# Patient Record
Sex: Female | Born: 1990 | Race: Black or African American | Hispanic: No | Marital: Single | Smoking: Never smoker
Health system: Southern US, Community
[De-identification: ages and names within clinical notes are randomized; demographics above are authoritative.]

---

## 2014-07-29 ENCOUNTER — Encounter (HOSPITAL_COMMUNITY): Payer: Self-pay | Admitting: Emergency Medicine

## 2014-07-29 ENCOUNTER — Emergency Department (HOSPITAL_COMMUNITY)
Admission: EM | Admit: 2014-07-29 | Discharge: 2014-07-30 | Disposition: A | Discharge status: Home / Self Care | Payer: Self-pay | Attending: Emergency Medicine | Admitting: Emergency Medicine

## 2014-07-29 DIAGNOSIS — N390 Urinary tract infection, site not specified: Secondary | ICD-10-CM | POA: Insufficient documentation

## 2014-07-29 DIAGNOSIS — R102 Pelvic and perineal pain: Secondary | ICD-10-CM

## 2014-07-29 DIAGNOSIS — D259 Leiomyoma of uterus, unspecified: Secondary | ICD-10-CM | POA: Insufficient documentation

## 2014-07-29 DIAGNOSIS — Z3202 Encounter for pregnancy test, result negative: Secondary | ICD-10-CM | POA: Insufficient documentation

## 2014-07-29 DIAGNOSIS — R112 Nausea with vomiting, unspecified: Secondary | ICD-10-CM | POA: Insufficient documentation

## 2014-07-29 DIAGNOSIS — N8320 Unspecified ovarian cysts: Secondary | ICD-10-CM | POA: Insufficient documentation

## 2014-07-29 DIAGNOSIS — N83202 Unspecified ovarian cyst, left side: Secondary | ICD-10-CM

## 2014-07-29 DIAGNOSIS — R509 Fever, unspecified: Secondary | ICD-10-CM | POA: Insufficient documentation

## 2014-07-29 LAB — URINALYSIS, ROUTINE W REFLEX MICROSCOPIC
BILIRUBIN URINE: NEGATIVE
Glucose, UA: NEGATIVE mg/dL
Ketones, ur: 15 mg/dL — AB
NITRITE: NEGATIVE
PROTEIN: 100 mg/dL — AB
SPECIFIC GRAVITY, URINE: 1.023 (ref 1.005–1.030)
UROBILINOGEN UA: 1 mg/dL (ref 0.0–1.0)
pH: 8 (ref 5.0–8.0)

## 2014-07-29 LAB — CBC WITH DIFFERENTIAL/PLATELET
BASOS ABS: 0.1 10*3/uL (ref 0.0–0.1)
BASOS PCT: 1 % (ref 0–1)
EOS ABS: 0.1 10*3/uL (ref 0.0–0.7)
Eosinophils Relative: 2 % (ref 0–5)
HCT: 36.9 % (ref 36.0–46.0)
HEMOGLOBIN: 12.7 g/dL (ref 12.0–15.0)
Lymphocytes Relative: 38 % (ref 12–46)
Lymphs Abs: 2.7 10*3/uL (ref 0.7–4.0)
MCH: 29.7 pg (ref 26.0–34.0)
MCHC: 34.4 g/dL (ref 30.0–36.0)
MCV: 86.2 fL (ref 78.0–100.0)
MONO ABS: 0.3 10*3/uL (ref 0.1–1.0)
MONOS PCT: 5 % (ref 3–12)
Neutro Abs: 3.7 10*3/uL (ref 1.7–7.7)
Neutrophils Relative %: 54 % (ref 43–77)
Platelets: 322 10*3/uL (ref 150–400)
RBC: 4.28 MIL/uL (ref 3.87–5.11)
RDW: 13.3 % (ref 11.5–15.5)
WBC: 6.9 10*3/uL (ref 4.0–10.5)

## 2014-07-29 LAB — WET PREP, GENITAL
Trich, Wet Prep: NONE SEEN
WBC, Wet Prep HPF POC: NONE SEEN
Yeast Wet Prep HPF POC: NONE SEEN

## 2014-07-29 LAB — COMPREHENSIVE METABOLIC PANEL
ALT: 8 U/L (ref 0–35)
AST: 19 U/L (ref 0–37)
Albumin: 4 g/dL (ref 3.5–5.2)
Alkaline Phosphatase: 61 U/L (ref 39–117)
Anion gap: 10 (ref 5–15)
BUN: 8 mg/dL (ref 6–23)
CALCIUM: 9.2 mg/dL (ref 8.4–10.5)
CO2: 19 mmol/L (ref 19–32)
Chloride: 108 mEq/L (ref 96–112)
Creatinine, Ser: 0.74 mg/dL (ref 0.50–1.10)
GFR calc Af Amer: >90 mL/min (ref >90)
Glucose, Bld: 112 mg/dL — ABNORMAL HIGH (ref 70–99)
POTASSIUM: 3.1 mmol/L — AB (ref 3.5–5.1)
Sodium: 137 mmol/L (ref 135–145)
Total Bilirubin: 0.4 mg/dL (ref 0.3–1.2)
Total Protein: 7.4 g/dL (ref 6.0–8.3)

## 2014-07-29 LAB — URINE MICROSCOPIC-ADD ON

## 2014-07-29 LAB — PREGNANCY, URINE: Preg Test, Ur: NEGATIVE

## 2014-07-29 NOTE — ED Notes (Signed)
Dr Yelverton at bedside.  

## 2014-07-29 NOTE — ED Notes (Signed)
Pt. reports low abdominal /menstrual cramping radiating to lower back onset today with emesis ( x1) , denies diarrhea , no fever or chills.

## 2014-07-29 NOTE — ED Provider Notes (Signed)
CSN: [id number]     Arrival date & time 07/29/14  1916 History  This chart was scribed for Julianne Rice, MD by Rayfield Citizen, ED Scribe. This patient was seen in room B14C/B14C and the patient's care was started at 11:09 PM.    Chief Complaint  Patient presents with  . Abdominal Pain   Patient is a 24 y.o. female presenting with abdominal pain. The history is provided by the patient. A language interpreter was used.  Abdominal Pain Associated symptoms: dysuria, fever, nausea, vaginal bleeding and vomiting   Associated symptoms: no chills, no constipation, no diarrhea and no vaginal discharge      HPI Comments: Shelia French is a 24 y.o. female who presents to the Emergency Department complaining of lower abdominal pain described as cramping (L>R) with associated subjective fever, nausea, vomiting (1x).  She reports that this pain began 4 hours ago; her pain has improved slightly since arriving in the ED tonight. Patient explains that her pain radiates around both sides of her back and down both legs as well. Mother inserts that patient has also experienced urinary symptoms. She attempted to treat her pain with ibuprofen. She denies nausea at present, diarrhea, or chills. She denies prior experience with this severity of symptoms; her pain is "a little different" than prior menstrual cramps.  She is on her period at this time (3rd day of her cycle). She denies any other abnormal discharge.    History reviewed. No pertinent past medical history. History reviewed. No pertinent past surgical history. No family history on file. History  Substance Use Topics  . Smoking status: Never Smoker   . Smokeless tobacco: Not on file  . Alcohol Use: No   OB History    No data available     Review of Systems  Constitutional: Positive for fever. Negative for chills.  Gastrointestinal: Positive for nausea, vomiting and abdominal pain. Negative for diarrhea and constipation.  Genitourinary: Positive for  dysuria, frequency, vaginal bleeding and pelvic pain. Negative for vaginal discharge.  Skin: Negative for rash and wound.  Neurological: Negative for weakness, numbness and headaches.  All other systems reviewed and are negative.     Allergies  Review of patient's allergies indicates no known allergies.  Home Medications   Prior to Admission medications   Not on File   BP 97/67 mmHg  Pulse 59  Temp(Src) 99.1 F (37.3 C) (Oral)  Resp 15  SpO2 100%  LMP 07/26/2014 Physical Exam  Constitutional: She is oriented to person, place, and time. She appears well-developed and well-nourished. No distress.  HENT:  Head: Normocephalic and atraumatic.  Mouth/Throat: Oropharynx is clear and moist.  Eyes: EOM are normal. Pupils are equal, round, and reactive to light.  Neck: Normal range of motion. Neck supple.  Cardiovascular: Normal rate and regular rhythm.   Pulmonary/Chest: Effort normal and breath sounds normal. No respiratory distress. She has no wheezes. She has no rales.  Abdominal: Soft. Bowel sounds are normal. She exhibits no distension and no mass. There is tenderness (Pt has bl lower abd TTP worse on left). There is no rebound and no guarding.  Genitourinary:  Dark blood in vaginal vault. No active bleeding. Yellow cervical discharge. No definite cervical motion tenderness. Bilateral adnexal tenderness appears to be worse on the right than on the left. No masses appreciated  Musculoskeletal: Normal range of motion. She exhibits no edema or tenderness.  No CVA tenderness bilaterally.  Neurological: She is alert and oriented to person, place, and  time.  Skin: Skin is warm and dry. No rash noted. No erythema.  Psychiatric: She has a normal mood and affect. Her behavior is normal.  Nursing note and vitals reviewed.   ED Course  Procedures   DIAGNOSTIC STUDIES: Oxygen Saturation is 100% on RA, normal by my interpretation.    COORDINATION OF CARE: 11:23 PM Discussed treatment  plan with pt at bedside and pt agreed to plan.   Labs Review Labs Reviewed  WET PREP, GENITAL - Abnormal; Notable for the following:    Clue Cells Wet Prep HPF POC FEW (*)    All other components within normal limits  COMPREHENSIVE METABOLIC PANEL - Abnormal; Notable for the following:    Potassium 3.1 (*)    Glucose, Bld 112 (*)    All other components within normal limits  URINALYSIS, ROUTINE W REFLEX MICROSCOPIC - Abnormal; Notable for the following:    Color, Urine RED (*)    APPearance TURBID (*)    Hgb urine dipstick LARGE (*)    Ketones, ur 15 (*)    Protein, ur 100 (*)    Leukocytes, UA MODERATE (*)    All other components within normal limits  URINE MICROSCOPIC-ADD ON - Abnormal; Notable for the following:    Squamous Epithelial / LPF FEW (*)    Bacteria, UA MANY (*)    All other components within normal limits  GC/CHLAMYDIA PROBE AMP  CBC WITH DIFFERENTIAL  PREGNANCY, URINE  HIV ANTIBODY (ROUTINE TESTING)    Imaging Review US Transvaginal Non-ob  07/30/2014   CLINICAL DATA:  Pelvic pain.  EXAM: TRANSABDOMINAL AND TRANSVAGINAL ULTRASOUND OF PELVIS  DOPPLER ULTRASOUND OF OVARIES  TECHNIQUE: Both transabdominal and transvaginal ultrasound examinations of the pelvis were performed. Transabdominal technique was performed for global imaging of the pelvis including uterus, ovaries, adnexal regions, and pelvic cul-de-sac.  It was necessary to proceed with endovaginal exam following the transabdominal exam to visualize the ovaries and endometrium. Color and duplex Doppler ultrasound was utilized to evaluate blood flow to the ovaries.  COMPARISON:  None.  FINDINGS: Uterus  Measurements: 8.1 x 4.5 x 9.9 cm. Hypoechoic 2.1 x 1.7 x 1.9 cm hypoechoic mass within the uterine fundus intramural leiomyoma.  Endometrium  Thickness: 1.1 cm.  No focal abnormality visualized.  Right ovary irregular in shape.  Measurements: 6.1 x 5.2 x 5.4 cm. 5.4 x 4.2 x 4.5 cm anechoic cyst with a small amount of  layering debris, this cyst is somewhat.  Left ovary  Measurements: 3.9 x 2.3 x 2.1 cm. Normal appearance/no adnexal mass.  Pulsed Doppler evaluation of both ovaries demonstrates normal low-resistance arterial and venous waveforms.  Other findings  No free fluid.  IMPRESSION: 4.5 cm indeterminate but likely benign LEFT adnexal cyst for which follow-up pelvic ultrasound in 6-12 weeks is recommended.  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   Electronically Signed   By: Elon Alas   On: 07/30/2014 03:29   US Pelvis Complete  07/30/2014   CLINICAL DATA:  Pelvic pain.  EXAM: TRANSABDOMINAL AND TRANSVAGINAL ULTRASOUND OF PELVIS  DOPPLER ULTRASOUND OF OVARIES  TECHNIQUE: Both transabdominal and transvaginal ultrasound examinations of the pelvis were performed. Transabdominal technique was performed for global imaging of the pelvis including uterus, ovaries, adnexal regions, and pelvic cul-de-sac.  It was necessary to proceed with endovaginal exam following the transabdominal exam to visualize the ovaries and endometrium. Color and duplex Doppler ultrasound was utilized to evaluate blood flow to the ovaries.  COMPARISON:  None.  FINDINGS:  Uterus  Measurements: 8.1 x 4.5 x 9.9 cm. Hypoechoic 2.1 x 1.7 x 1.9 cm hypoechoic mass within the uterine fundus intramural leiomyoma.  Endometrium  Thickness: 1.1 cm.  No focal abnormality visualized.  Right ovary irregular in shape.  Measurements: 6.1 x 5.2 x 5.4 cm. 5.4 x 4.2 x 4.5 cm anechoic cyst with a small amount of layering debris, this cyst is somewhat.  Left ovary  Measurements: 3.9 x 2.3 x 2.1 cm. Normal appearance/no adnexal mass.  Pulsed Doppler evaluation of both ovaries demonstrates normal low-resistance arterial and venous waveforms.  Other findings  No free fluid.  IMPRESSION: 4.5 cm indeterminate but likely benign LEFT adnexal cyst for which follow-up pelvic ultrasound in 6-12 weeks is recommended.  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   Electronically  Signed   By: Elon Alas   On: 07/30/2014 03:29   [place] Art/ven Flow Abd Pelv Doppler  07/30/2014   CLINICAL DATA:  Pelvic pain.  EXAM: TRANSABDOMINAL AND TRANSVAGINAL ULTRASOUND OF PELVIS  DOPPLER ULTRASOUND OF OVARIES  TECHNIQUE: Both transabdominal and transvaginal ultrasound examinations of the pelvis were performed. Transabdominal technique was performed for global imaging of the pelvis including uterus, ovaries, adnexal regions, and pelvic cul-de-sac.  It was necessary to proceed with endovaginal exam following the transabdominal exam to visualize the ovaries and endometrium. Color and duplex Doppler ultrasound was utilized to evaluate blood flow to the ovaries.  COMPARISON:  None.  FINDINGS: Uterus  Measurements: 8.1 x 4.5 x 9.9 cm. Hypoechoic 2.1 x 1.7 x 1.9 cm hypoechoic mass within the uterine fundus intramural leiomyoma.  Endometrium  Thickness: 1.1 cm.  No focal abnormality visualized.  Right ovary irregular in shape.  Measurements: 6.1 x 5.2 x 5.4 cm. 5.4 x 4.2 x 4.5 cm anechoic cyst with a small amount of layering debris, this cyst is somewhat.  Left ovary  Measurements: 3.9 x 2.3 x 2.1 cm. Normal appearance/no adnexal mass.  Pulsed Doppler evaluation of both ovaries demonstrates normal low-resistance arterial and venous waveforms.  Other findings  No free fluid.  IMPRESSION: 4.5 cm indeterminate but likely benign LEFT adnexal cyst for which follow-up pelvic ultrasound in 6-12 weeks is recommended.  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   Electronically Signed   By: Elon Alas   On: 07/30/2014 03:29     EKG Interpretation None      MDM   Final diagnoses:  Pelvic pain in female    I personally performed the services described in this documentation, which was scribed in my presence. The recorded information has been reviewed and is accurate.    Discuss results of workup at length with the patient through interpreter. Advised follow-up with OB/GYN clinic. She's been given  return precautions and has voiced understanding.  Julianne Rice, MD 07/30/14 [phone]

## 2014-07-30 ENCOUNTER — Emergency Department (HOSPITAL_COMMUNITY): Payer: Self-pay

## 2014-07-30 MED ORDER — NITROFURANTOIN MONOHYD MACRO 100 MG PO CAPS: 100.0000 mg | ORAL_CAPSULE | Freq: Two times a day (BID) | ORAL | Status: AC

## 2014-07-30 MED ORDER — IBUPROFEN 600 MG PO TABS: 600.0000 mg | ORAL_TABLET | Freq: Four times a day (QID) | ORAL | Status: AC | PRN

## 2014-07-30 MED ORDER — HYDROCODONE-ACETAMINOPHEN 5-325 MG PO TABS: 1.0000 | ORAL_TABLET | ORAL | Status: AC | PRN

## 2014-07-30 MED ORDER — HYDROCODONE-ACETAMINOPHEN 5-325 MG PO TABS
1.0000 | ORAL_TABLET | Freq: Once | ORAL | Status: AC
  Administered 2014-07-30: 1 via ORAL
  Filled 2014-07-30: qty 1

## 2014-07-30 NOTE — ED Notes (Signed)
Pt A&OX4, ambulatory at d/c with steady gait, NAD 

## 2014-07-30 NOTE — Discharge Instructions (Signed)
Make an appointment to follow-up with an OB/GYN. Return immediately for worsening pain, fever, increased vaginal bleeding or for any concerns.       Ovarian Cyst An ovarian cyst is a fluid-filled sac that forms on an ovary. The ovaries are small organs that produce eggs in women. Various types of cysts can form on the ovaries. Most are not cancerous. Many do not cause problems, and they often go away on their own. Some may cause symptoms and require treatment. Common types of ovarian cysts include: Functional cysts--These cysts may occur every month during the menstrual cycle. This is normal. The cysts usually go away with the next menstrual cycle if the woman does not get pregnant. Usually, there are no symptoms with a functional cyst. Endometrioma cysts--These cysts form from the tissue that lines the uterus. They are also called "chocolate cysts" because they become filled with blood that turns brown. This type of cyst can cause pain in the lower abdomen during intercourse and with your menstrual period. Cystadenoma cysts--This type develops from the cells on the outside of the ovary. These cysts can get very big and cause lower abdomen pain and pain with intercourse. This type of cyst can twist on itself, cut off its blood supply, and cause severe pain. It can also easily rupture and cause a lot of pain. Dermoid cysts--This type of cyst is sometimes found in both ovaries. These cysts may contain different kinds of body tissue, such as skin, teeth, hair, or cartilage. They usually do not cause symptoms unless they get very big. Theca lutein cysts--These cysts occur when too much of a certain hormone (human chorionic gonadotropin) is produced and overstimulates the ovaries to produce an egg. This is most common after procedures used to assist with the conception of a baby (in vitro fertilization). CAUSES  Fertility drugs can cause a condition in which multiple large cysts are formed on the ovaries.  This is called ovarian hyperstimulation syndrome. A condition called polycystic ovary syndrome can cause hormonal imbalances that can lead to nonfunctional ovarian cysts. SIGNS AND SYMPTOMS  Many ovarian cysts do not cause symptoms. If symptoms are present, they may include: Pelvic pain or pressure. Pain in the lower abdomen. Pain during sexual intercourse. Increasing girth (swelling) of the abdomen. Abnormal menstrual periods. Increasing pain with menstrual periods. Stopping having menstrual periods without being pregnant. DIAGNOSIS  These cysts are commonly found during a routine or annual pelvic exam. Tests may be ordered to find out more about the cyst. These tests may include: Ultrasound. X-ray of the pelvis. CT scan. MRI. Blood tests. TREATMENT  Many ovarian cysts go away on their own without treatment. Your health care provider may want to check your cyst regularly for 2-3 months to see if it changes. For women in menopause, it is particularly important to monitor a cyst closely because of the higher rate of ovarian cancer in menopausal women. When treatment is needed, it may include any of the following: A procedure to drain the cyst (aspiration). This may be done using a long needle and ultrasound. It can also be done through a laparoscopic procedure. This involves using a thin, lighted tube with a tiny camera on the end (laparoscope) inserted through a small incision. Surgery to remove the whole cyst. This may be done using laparoscopic surgery or an open surgery involving a larger incision in the lower abdomen. Hormone treatment or birth control pills. These methods are sometimes used to help dissolve a cyst. HOME CARE INSTRUCTIONS  Only take over-the-counter or prescription medicines as directed by your health care provider. Follow up with your health care provider as directed. Get regular pelvic exams and Pap tests. SEEK MEDICAL CARE IF:  Your periods are late, irregular, or  painful, or they stop. Your pelvic pain or abdominal pain does not go away. Your abdomen becomes larger or swollen. You have pressure on your bladder or trouble emptying your bladder completely. You have pain during sexual intercourse. You have feelings of fullness, pressure, or discomfort in your stomach. You lose weight for no apparent reason. You feel generally ill. You become constipated. You lose your appetite. You develop acne. You have an increase in body and facial hair. You are gaining weight, without changing your exercise and eating habits. You think you are pregnant. SEEK IMMEDIATE MEDICAL CARE IF:  You have increasing abdominal pain. You feel sick to your stomach (nauseous), and you throw up (vomit). You develop a fever that comes on suddenly. You have abdominal pain during a bowel movement. Your menstrual periods become heavier than usual. MAKE SURE YOU: Understand these instructions. Will watch your condition. Will get help right away if you are not doing well or get worse. Document Released: 07/09/2005 Document Revised: 07/14/2013 Document Reviewed: 03/16/2013 [facility] Patient Information 2015 [place], [place]. This information is not intended to replace advice given to you by your health care provider. Make sure you discuss any questions you have with your health care provider. Uterine Fibroid A uterine fibroid is a growth (tumor) that occurs in your uterus. This type of tumor is not cancerous and does not spread out of the uterus. You can have one or many fibroids. Fibroids can vary in size, weight, and where they grow in the uterus. Some can become quite large. Most fibroids do not require medical treatment, but some can cause pain or heavy bleeding during and between periods. CAUSES  A fibroid is the result of a single uterine cell that keeps growing (unregulated), which is different than most cells in the human body. Most cells have a control mechanism that keeps them  from reproducing without control.  SIGNS AND SYMPTOMS   Bleeding.  Pelvic pain and pressure.  Bladder problems due to the size of the fibroid.  Infertility and miscarriages depending on the size and location of the fibroid. DIAGNOSIS  Uterine fibroids are diagnosed through a physical exam. Your health care provider may feel the lumpy tumors during a pelvic exam. Ultrasonography may be done to get information regarding size, location, and number of tumors.  TREATMENT   Your health care provider may recommend watchful waiting. This involves getting the fibroid checked by your health care provider to see if it grows or shrinks.   Hormone treatment or an intrauterine device (IUD) may be prescribed.   Surgery may be needed to remove the fibroids (myomectomy) or the uterus (hysterectomy). This depends on your situation. When fibroids interfere with fertility and a woman wants to become pregnant, a health care provider may recommend having the fibroids removed.  [place] care depends on how you were treated. In general:   Keep all follow-up appointments with your health care provider.   Only take over-the-counter or prescription medicines as directed by your health care provider. If you were prescribed a hormone treatment, take the hormone medicines exactly as directed. Do not take aspirin. It can cause bleeding.   Talk to your health care provider about taking iron pills.  If your periods are troublesome  but not so heavy, lie down with your feet raised slightly above your heart. Place cold packs on your lower abdomen.   If your periods are heavy, write down the number of pads or tampons you use per month. Bring this information to your health care provider.   Include green vegetables in your diet.  SEEK IMMEDIATE MEDICAL CARE IF:  You have pelvic pain or cramps not controlled with medicines.   You have a sudden increase in pelvic pain.   You have an  increase in bleeding between and during periods.   You have excessive periods and soak tampons or pads in a half hour or less.  You feel lightheaded or have fainting episodes. Document Released: 07/06/2000 Document Revised: 04/29/2013 Document Reviewed: 02/05/2013 [facility] Patient Information 2015 [place], [place]. This information is not intended to replace advice given to you by your health care provider. Make sure you discuss any questions you have with your health care provider.

## 2014-07-31 LAB — GC/CHLAMYDIA PROBE AMP
CT Probe RNA: NEGATIVE
GC PROBE AMP APTIMA: NEGATIVE

## 2014-07-31 LAB — HIV ANTIBODY (ROUTINE TESTING W REFLEX)
HIV 1/HIV 2 AB: NONREACTIVE
HIV 1/O/2 Abs-Index Value: <1 (ref <1.00)

## 2016-03-27 IMAGING — US US PELVIS COMPLETE
1 series · 13 of 25 positions shown · non-contrast
Comparison: None.

CLINICAL DATA: Pelvic pain.

EXAM:
TRANSABDOMINAL AND TRANSVAGINAL ULTRASOUND OF PELVIS
DOPPLER ULTRASOUND OF OVARIES
TECHNIQUE: Both transabdominal and transvaginal ultrasound examinations of the
pelvis were performed. Transabdominal technique was performed for
global imaging of the pelvis including uterus, ovaries, adnexal
regions, and pelvic cul-de-sac.
It was necessary to proceed with endovaginal exam following the
transabdominal exam to visualize the ovaries and endometrium.. Color
and duplex Doppler ultrasound was utilized to evaluate blood flow to
the ovaries.

[Series 1: us pelvis complete · 0.24mm/px · 13 of 41 slices shown]
[im 1/41]
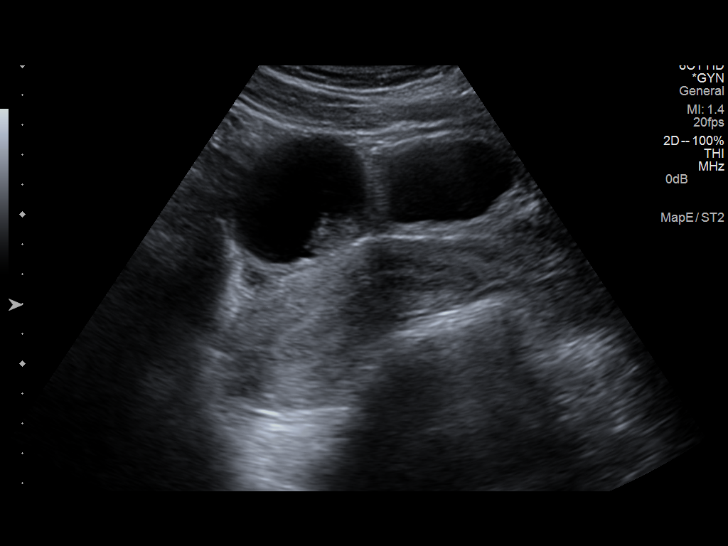
[im 4/41]
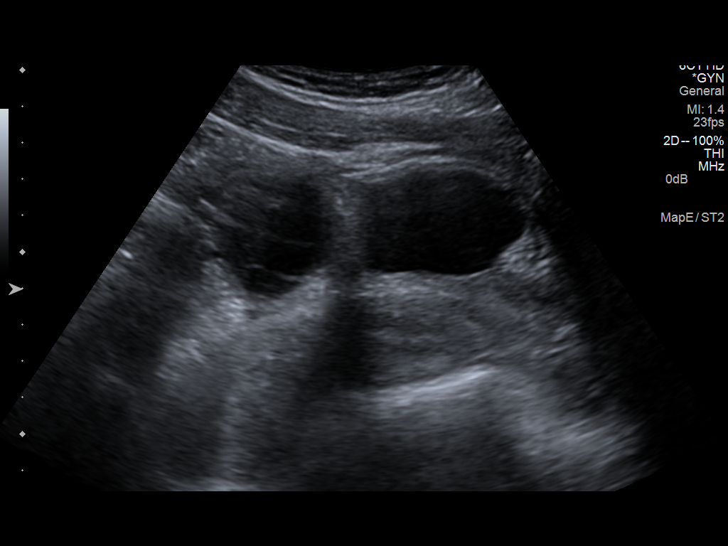
[im 7/41]
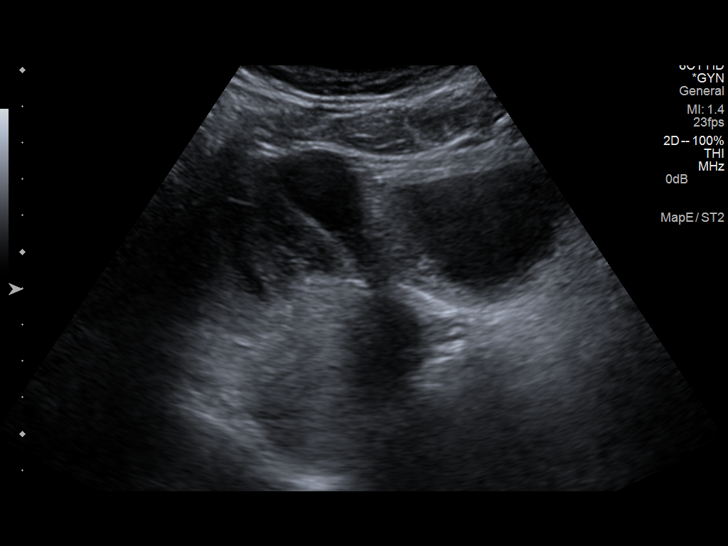
[im 11/41]
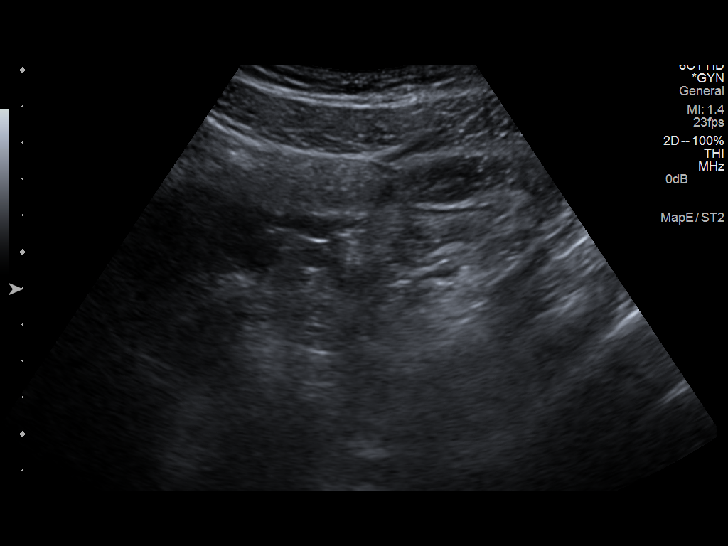
[im 14/41]
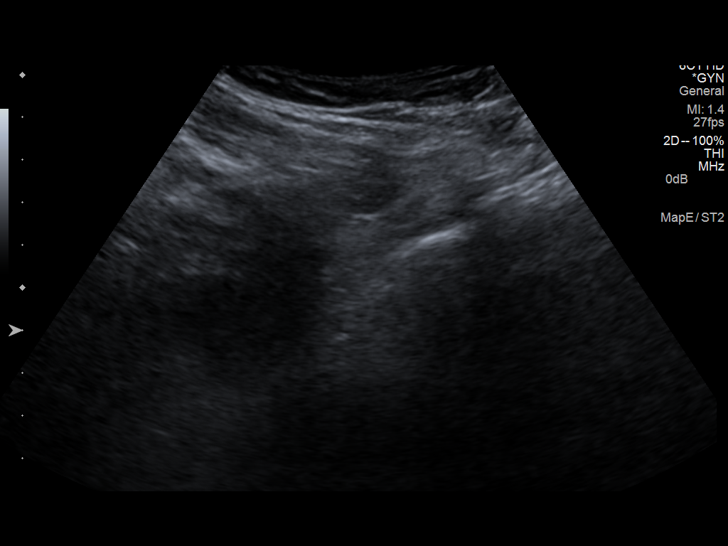
[im 17/41]
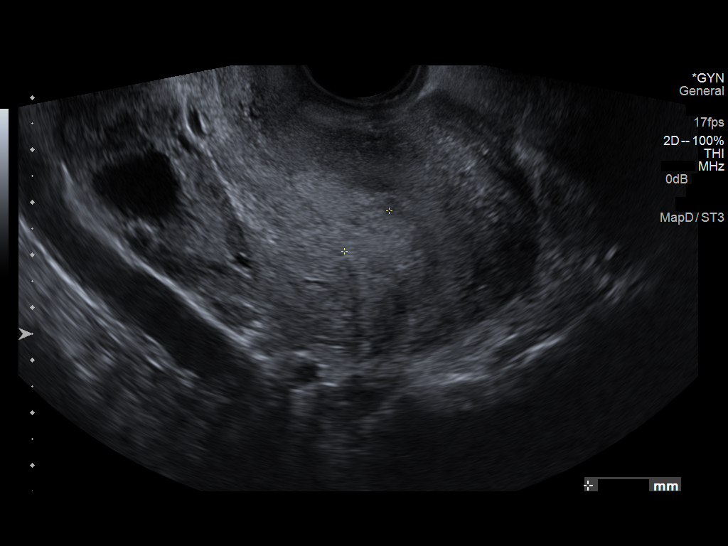
[im 21/41]
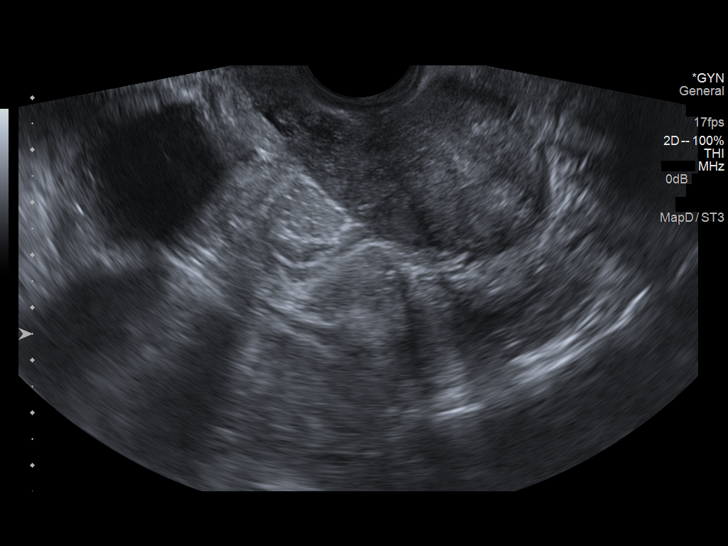
[im 24/41]
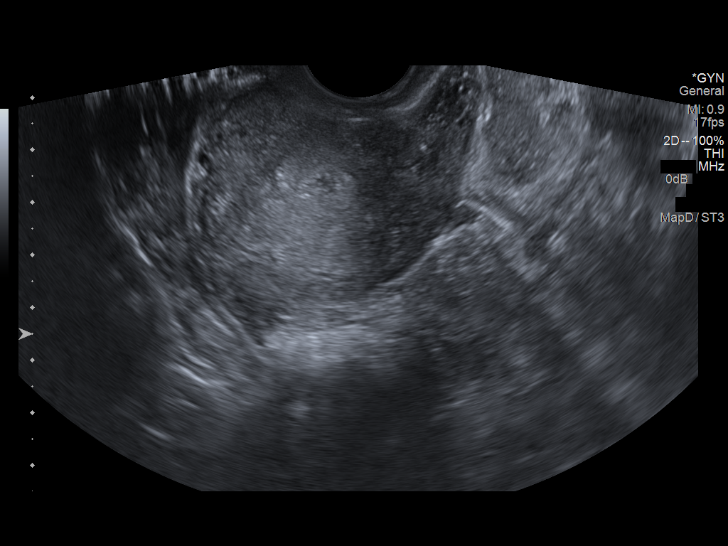
[im 27/41]
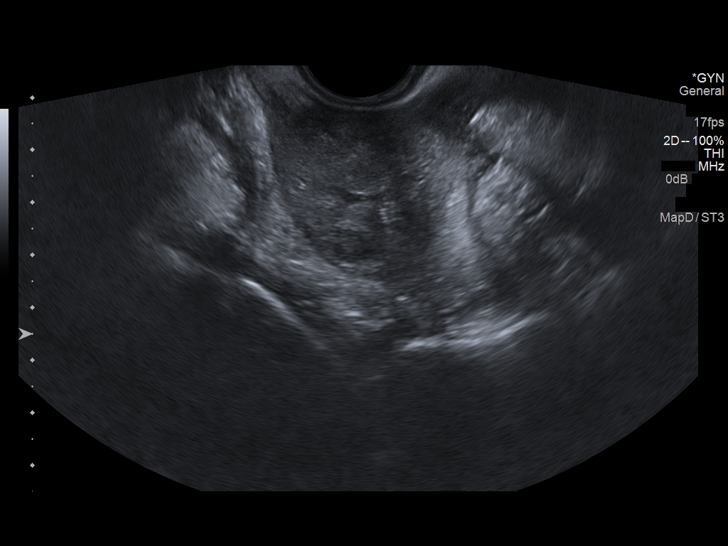
[im 31/41]
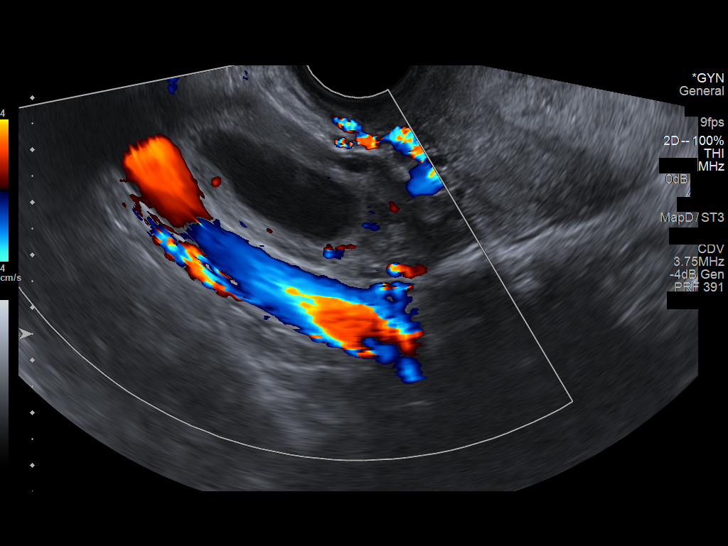
[im 34/41]
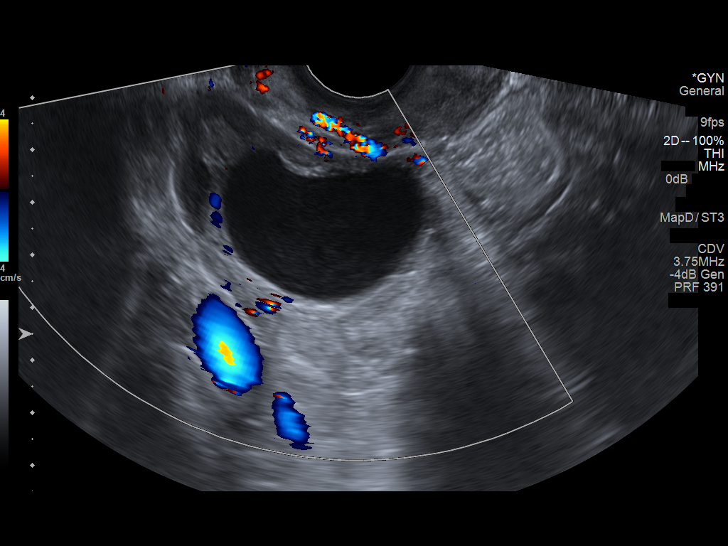
[im 37/41]
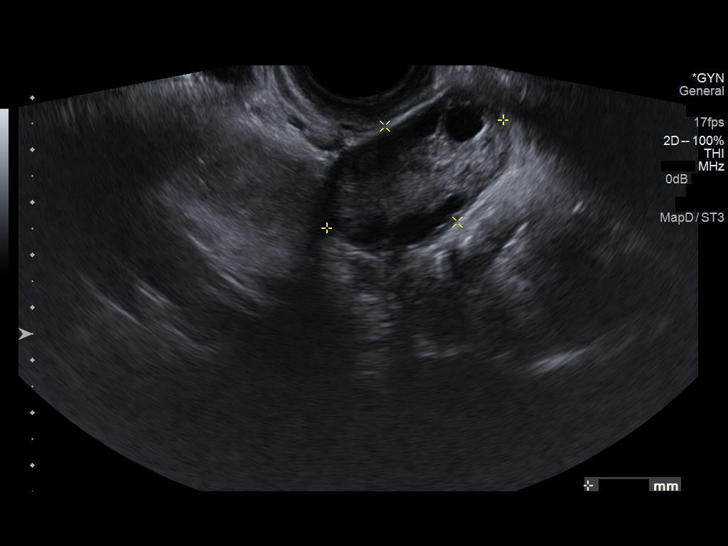
[im 41/41]
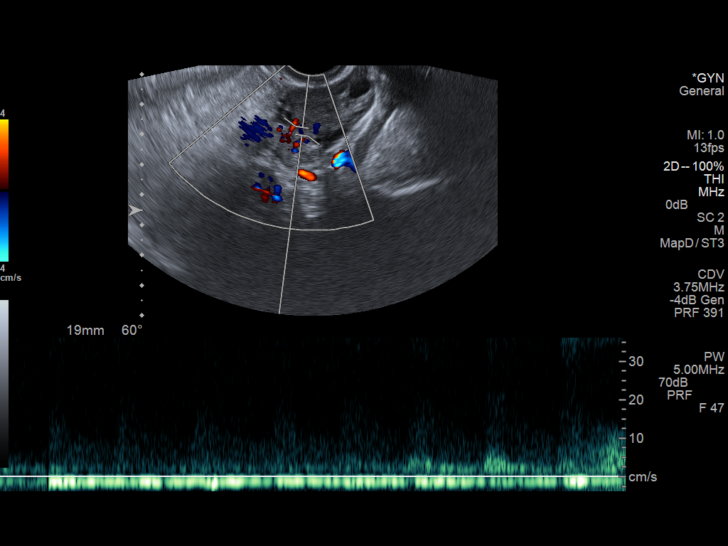

[13 of 25 positions shown; findings below may reference images not displayed]

FINDINGS: Uterus

Measurements: 8.1 x 4.5 x 9.9 cm. Hypoechoic 2.1 x 1.7 x 1.9 cm
hypoechoic mass within the uterine fundus intramural leiomyoma.

Endometrium

Thickness: 1.1 cm.  No focal abnormality visualized.

Right ovary irregular in shape.

Measurements: 6.1 x 5.2 x 5.4 cm. 5.4 x 4.2 x 4.5 cm anechoic cyst
with a small amount of layering debris, this cyst is somewhat.

Left ovary

Measurements: 3.9 x 2.3 x 2.1 cm. Normal appearance/no adnexal mass.

Pulsed Doppler evaluation of both ovaries demonstrates normal
low-resistance arterial and venous waveforms.

Other findings

No free fluid.
IMPRESSION: 4.5 cm indeterminate but likely benign LEFT adnexal cyst for which
follow-up pelvic ultrasound in 6-12 weeks is recommended.

2.1 cm uterine fundal intramural myoma.

  By: Encarnacio Amghar
